# Patient Record
Sex: Male | Born: 1990 | Race: Asian | Hispanic: No | Marital: Married | Smoking: Never smoker
Health system: Southern US, Community
[De-identification: ages and names within clinical notes are randomized; demographics above are authoritative.]

## PROBLEM LIST (undated history)

## (undated) DIAGNOSIS — Z789 Other specified health status: Secondary | ICD-10-CM

## (undated) DIAGNOSIS — C801 Malignant (primary) neoplasm, unspecified: Secondary | ICD-10-CM

## (undated) HISTORY — PX: NO PAST SURGERIES: SHX2092

## (undated) HISTORY — DX: Other specified health status: Z78.9

---

## 2019-06-04 ENCOUNTER — Ambulatory Visit: Payer: Self-pay

## 2019-06-09 ENCOUNTER — Ambulatory Visit: Payer: Self-pay | Attending: Internal Medicine

## 2019-06-09 DIAGNOSIS — Z23 Encounter for immunization: Secondary | ICD-10-CM

## 2019-06-09 NOTE — Progress Notes (Signed)
   Covid-19 Vaccination Clinic  Name:  Yvonne Stueber    MRN: [id number] DOB: 1991-01-24  06/09/2019  Mr. Overacker was observed post Covid-19 immunization for 15 minutes without incident. He was provided with Vaccine Information Sheet and instruction to access the V-Safe system.   Mr. Connley was instructed to call 911 with any severe reactions post vaccine: Marland Kitchen Difficulty breathing  . Swelling of face and throat  . A fast heartbeat  . A bad rash all over body  . Dizziness and weakness   Immunizations Administered    Name Date Dose VIS Date Route   Pfizer COVID-19 Vaccine 06/09/2019  8:23 AM 0.3 mL 02/18/2019 Intramuscular   Manufacturer: [place]   Lot: [id number]   [place]: [id number]

## 2019-07-06 ENCOUNTER — Ambulatory Visit: Payer: Self-pay | Attending: Internal Medicine

## 2019-07-06 DIAGNOSIS — Z23 Encounter for immunization: Secondary | ICD-10-CM

## 2019-07-06 NOTE — Progress Notes (Signed)
   Covid-19 Vaccination Clinic  Name:  Gabriel Wheeler    MRN: [id number] DOB: Jul 20, 1990  07/06/2019  [name] was observed post Covid-19 immunization for 15 minutes without incident. He was provided with Vaccine Information Sheet and instruction to access the V-Safe system.   [name] was instructed to call 911 with any severe reactions post vaccine: Marland Kitchen Difficulty breathing  . Swelling of face and throat  . A fast heartbeat  . A bad rash all over body  . Dizziness and weakness   Immunizations Administered    Name Date Dose VIS Date Route   Pfizer COVID-19 Vaccine 07/06/2019  8:19 AM 0.3 mL 05/04/2018 Intramuscular   Manufacturer: [place]   Lot: [id number]   [place]: [id number]

## 2019-10-05 ENCOUNTER — Ambulatory Visit: Payer: 59 | Attending: Internal Medicine

## 2019-10-05 DIAGNOSIS — Z20822 Contact with and (suspected) exposure to covid-19: Secondary | ICD-10-CM

## 2019-10-06 ENCOUNTER — Ambulatory Visit (INDEPENDENT_AMBULATORY_CARE_PROVIDER_SITE_OTHER): Payer: 59 | Admitting: Sports Medicine

## 2019-10-06 ENCOUNTER — Other Ambulatory Visit: Payer: Self-pay

## 2019-10-06 ENCOUNTER — Encounter: Payer: Self-pay | Admitting: Sports Medicine

## 2019-10-06 DIAGNOSIS — M25561 Pain in right knee: Secondary | ICD-10-CM | POA: Diagnosis not present

## 2019-10-06 LAB — SARS-COV-2, NAA 2 DAY TAT

## 2019-10-06 LAB — NOVEL CORONAVIRUS, NAA: SARS-CoV-2, NAA: NOT DETECTED

## 2019-10-06 NOTE — Assessment & Plan Note (Signed)
This is a pleasant 29 year old male runner, approximately 6 miles per week, he works from home on a computer. 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 Subsequently he did well until recently, had some increasing pain and went to the [facility] medical clinic, x-rays were obtained, he was given some rehab exercises that seem to help, unfortunately continues to have pain, anterior knee, worse with sitting, squatting, going up and down stairs. On exam his ligaments are stable but he continues to have patellofemoral type symptoms, mild patellofemoral compression pain. Poor quadriceps definition. Due to persistent pain, history of hemarthrosis, multiple normal x-rays, and essentially failure of greater than 6 weeks of conservative measures we are going to proceed with an MRI, also given the patellofemoral rehabilitation exercises, return to see me in approximately a month, we will do formal physical therapy and referral for custom molded orthotics if not significantly better.

## 2019-10-06 NOTE — Progress Notes (Signed)
    Procedures performed today:    None.  Independent interpretation of notes and tests performed by another provider:   None.  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 Subsequently he did well until recently, had some increasing pain and went to the [facility] medical clinic, x-rays were obtained, he was given some rehab exercises that seem to help, unfortunately continues to have pain, anterior knee, worse with sitting, squatting, going up and down stairs. On exam his ligaments are stable but he continues to have patellofemoral type symptoms, mild patellofemoral compression pain. Poor quadriceps definition. Due to persistent pain, history of hemarthrosis, multiple normal x-rays, and essentially failure of greater than 6 weeks of conservative measures we are going to proceed with an MRI, also given the patellofemoral rehabilitation exercises, return to see me in approximately a month, we will do formal physical therapy and referral for custom molded orthotics if not significantly better.    ___________________________________________ Gwen Her. Dianah Field, M.D., ABFM., CAQSM. Primary Care and [place] Instructor of [place] of [facility]

## 2019-10-09 ENCOUNTER — Ambulatory Visit (INDEPENDENT_AMBULATORY_CARE_PROVIDER_SITE_OTHER): Payer: 59

## 2019-10-09 ENCOUNTER — Other Ambulatory Visit: Payer: Self-pay

## 2019-10-09 DIAGNOSIS — M25561 Pain in right knee: Secondary | ICD-10-CM

## 2019-10-09 IMAGING — MR MR KNEE*R* W/O CM
7 series · 40 of 40 positions shown · non-contrast
Comparison: None.

CLINICAL DATA: Chronic anterior knee pain following original injury
playing basketball 10 years ago. Worsening pain over the last 2
years. No previous relevant surgery.

EXAM:
MRI OF THE RIGHT KNEE WITHOUT CONTRAST
TECHNIQUE: Multiplanar, multisequence MR imaging of the knee was performed. No
intravenous contrast was administered.

[Series 6: T2 fat-sat · axial · 4.0mm · 0.53mm/px · z∈[-76,+94]mm · 9 of 35 slices shown (1 of 3)]
[im 1/35]
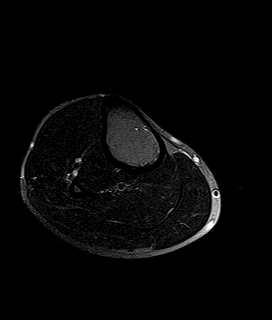
[im 5/35]
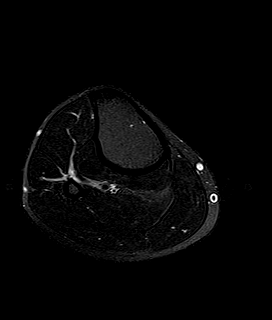
[im 9/35]
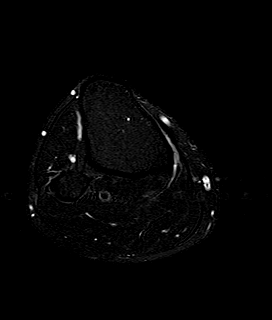
[im 13/35]
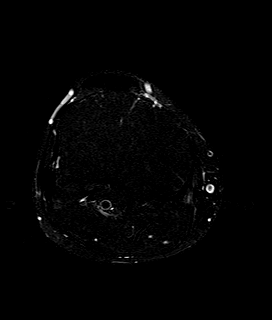
[im 18/35]
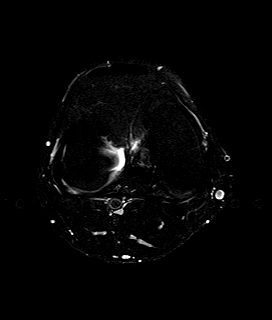
[im 22/35]
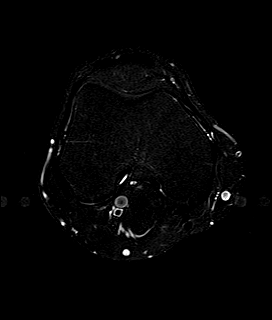
[im 26/35]
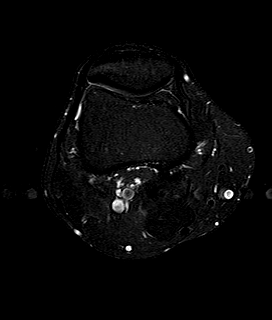
[im 30/35]
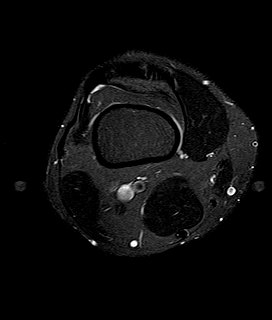
[im 35/35]
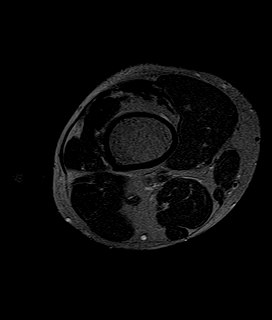

[Series 8: T1 · coronal · 4.0mm · 0.62mm/px · 5 of 25 slices shown]
[im 1/25]
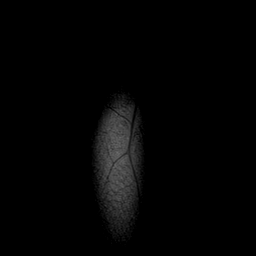
[im 7/25]
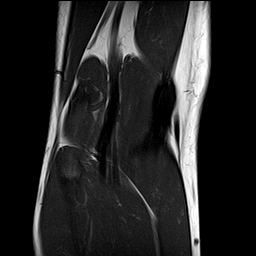
[im 13/25]
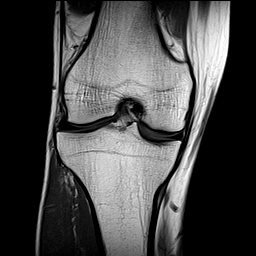
[im 19/25]
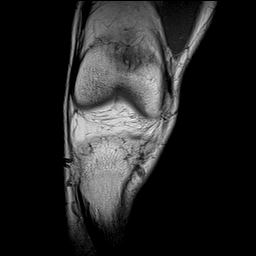
[im 25/25]
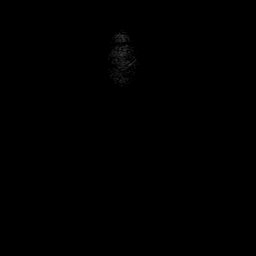

[Series 9: T2 fat-sat · coronal · 4.0mm · 0.62mm/px · 5 of 25 slices shown (2 of 3)]
[im 1/25]
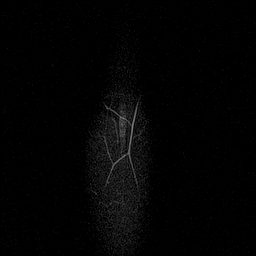
[im 7/25]
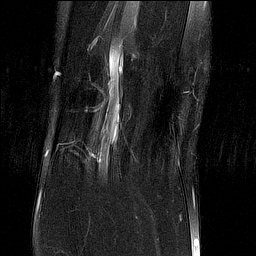
[im 13/25]
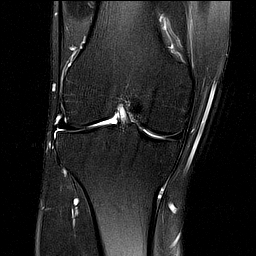
[im 19/25]
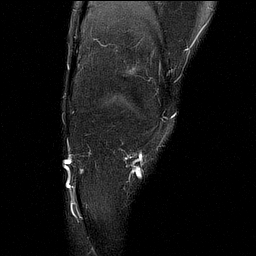
[im 25/25]
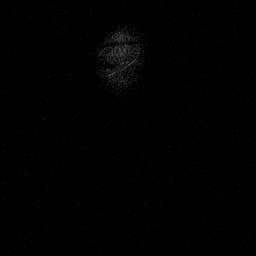

[Series 10: PD fat-sat · coronal · 4.0mm · 0.62mm/px · 5 of 25 slices shown (1 of 3)]
[im 1/25]
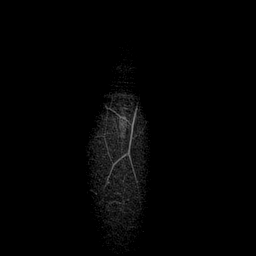
[im 7/25]
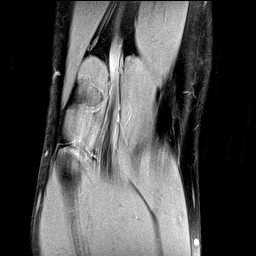
[im 13/25]
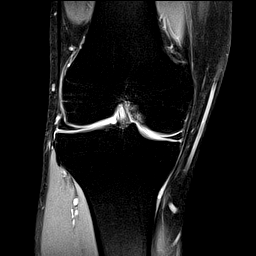
[im 19/25]
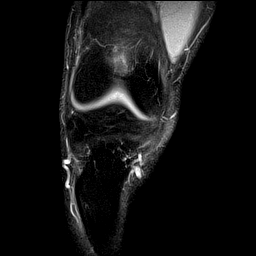
[im 25/25]
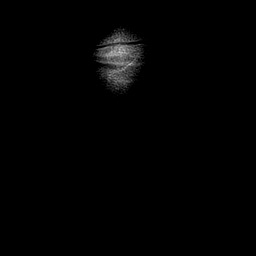

[Series 11: PD fat-sat · sagittal · 3.0mm · 0.62mm/px · 6 of 28 slices shown (2 of 3)]
[im 1/28]
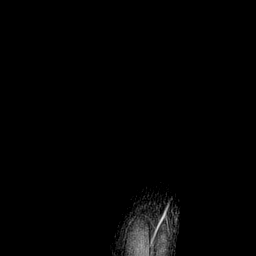
[im 6/28]
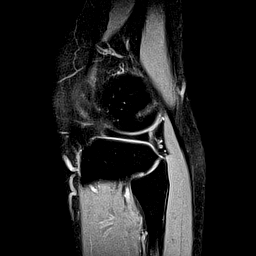
[im 11/28]
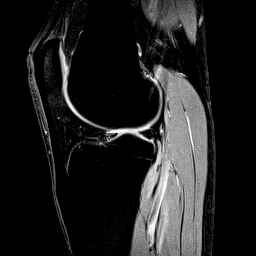
[im 17/28]
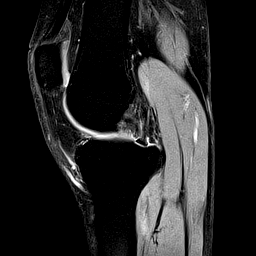
[im 22/28]
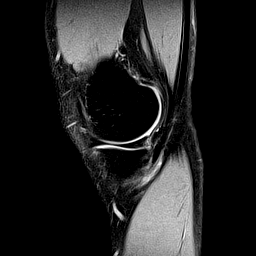
[im 28/28]
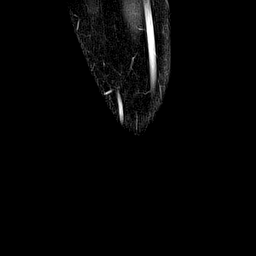

[Series 12: T2 fat-sat · sagittal · 3.0mm · 0.62mm/px · 6 of 28 slices shown (3 of 3)]
[im 1/28]
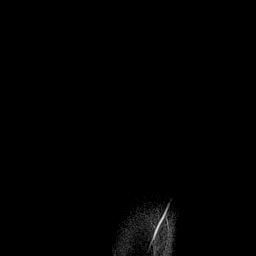
[im 6/28]
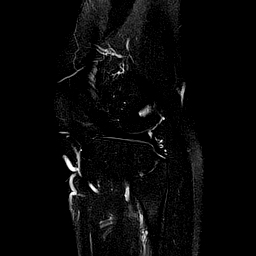
[im 11/28]
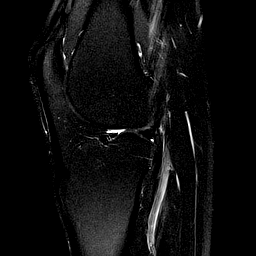
[im 17/28]
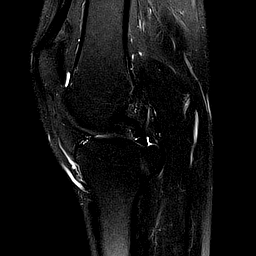
[im 22/28]
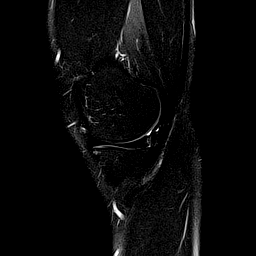
[im 28/28]
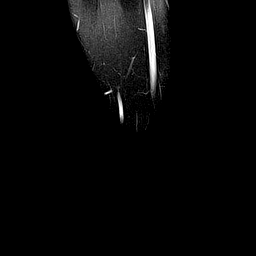

[Series 13: PD fat-sat · oblique · 2.0mm · 0.62mm/px · 4 of 19 slices shown (3 of 3)]
[im 1/19]
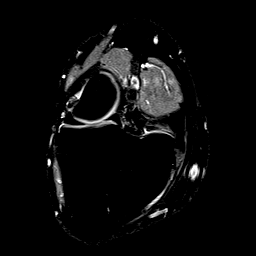
[im 7/19]
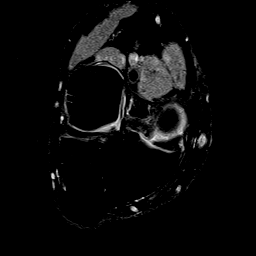
[im 13/19]
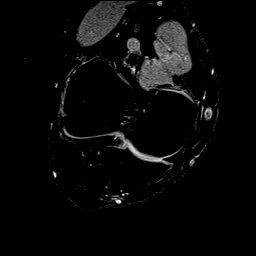
[im 19/19]
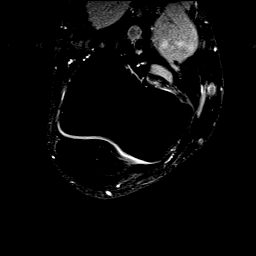

[40 of 40 positions shown; findings below may reference images not displayed]

FINDINGS: MENISCI

Medial meniscus:  Intact with normal morphology.

Lateral meniscus:  Discoid configuration without evidence of tear.

LIGAMENTS

Cruciates: Intact. The anterior cruciate ligament is relatively
diminutive with mild mucoid degeneration, although there are intact
fibers maintaining a normal orientation. The PCL is also relatively
diminutive, although intact.

Collaterals:  Intact.

CARTILAGE

Patellofemoral:  Preserved.

Medial:  Preserved.

Lateral:  Preserved.

MISCELLANEOUS

Joint:  No significant joint effusion.

Popliteal Fossa:  Unremarkable. No significant Baker's cyst.

Extensor Mechanism:  Intact.

Bones:  No acute or significant extra-articular osseous findings.

Other: No other significant periarticular soft tissue findings.
IMPRESSION: 1. No acute findings or evidence of internal derangement. The
menisci, cruciate and collateral ligaments are intact.
2. Discoid lateral meniscus without evidence of tear.
3. Mild mucoid degeneration of the anterior cruciate ligament.

## 2019-10-10 ENCOUNTER — Encounter (INDEPENDENT_AMBULATORY_CARE_PROVIDER_SITE_OTHER): Payer: 59

## 2019-10-10 DIAGNOSIS — M25561 Pain in right knee: Secondary | ICD-10-CM | POA: Diagnosis not present

## 2019-11-03 ENCOUNTER — Ambulatory Visit: Payer: 59 | Admitting: Sports Medicine

## 2020-01-21 ENCOUNTER — Other Ambulatory Visit: Payer: Self-pay

## 2020-01-21 ENCOUNTER — Ambulatory Visit: Payer: 59 | Attending: Internal Medicine

## 2020-01-21 DIAGNOSIS — Z23 Encounter for immunization: Secondary | ICD-10-CM

## 2020-01-21 NOTE — Progress Notes (Signed)
   Covid-19 Vaccination Clinic  Name:  Gabriel Wheeler    MRN: [id number] DOB: 30-Apr-1990  01/21/2020  Mr. Deines was observed post Covid-19 immunization for 15 minutes without incident. He was provided with Vaccine Information Sheet and instruction to access the V-Safe system.   Mr. Boivin was instructed to call 911 with any severe reactions post vaccine: Marland Kitchen Difficulty breathing  . Swelling of face and throat  . A fast heartbeat  . A bad rash all over body  . Dizziness and weakness   Immunizations Administered    Name Date Dose VIS Date Route   Pfizer COVID-19 Vaccine 01/21/2020  1:56 PM 0.3 mL 12/28/2019 Intramuscular   Manufacturer: [place]   Lot: [id number]   [place]: [id number]

## 2020-11-16 ENCOUNTER — Ambulatory Visit (HOSPITAL_COMMUNITY): Admission: EM | Admit: 2020-11-16 | Discharge: 2020-11-16 | Discharge status: Against Medical Advice | Payer: 59

## 2020-11-16 ENCOUNTER — Ambulatory Visit (HOSPITAL_BASED_OUTPATIENT_CLINIC_OR_DEPARTMENT_OTHER)
Admission: RE | Admit: 2020-11-16 | Discharge: 2020-11-16 | Disposition: A | Discharge status: Home / Self Care | Payer: 59 | Source: Ambulatory Visit | Attending: Internal Medicine | Admitting: Internal Medicine

## 2020-11-16 ENCOUNTER — Other Ambulatory Visit (HOSPITAL_COMMUNITY): Payer: Self-pay | Admitting: Internal Medicine

## 2020-11-16 ENCOUNTER — Other Ambulatory Visit: Payer: Self-pay | Admitting: Internal Medicine

## 2020-11-16 ENCOUNTER — Other Ambulatory Visit: Payer: Self-pay

## 2020-11-16 ENCOUNTER — Other Ambulatory Visit (HOSPITAL_BASED_OUTPATIENT_CLINIC_OR_DEPARTMENT_OTHER): Payer: Self-pay | Admitting: Internal Medicine

## 2020-11-16 DIAGNOSIS — R310 Gross hematuria: Secondary | ICD-10-CM | POA: Diagnosis present

## 2020-11-16 IMAGING — US US RENAL
1 series · 14 of 25 positions shown · non-contrast
Comparison: None.

CLINICAL DATA: Right-sided back pain lymphoma

EXAM:
RENAL / URINARY TRACT ULTRASOUND COMPLETE

[Series 1: us renal · 80 acquisitions, 14 frames shown]
[im 1/80]
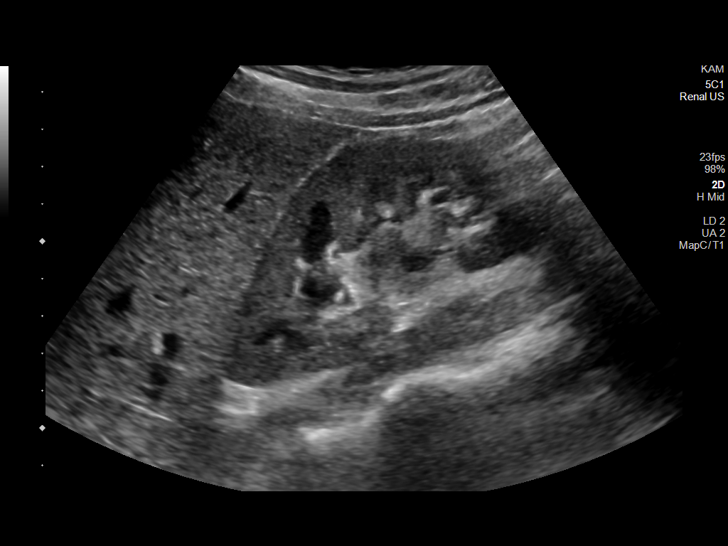
[im 7/80]
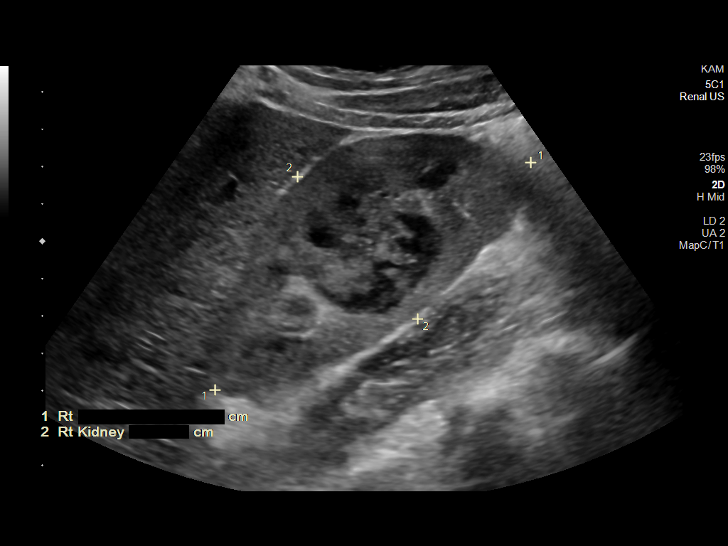
[im 14/80]
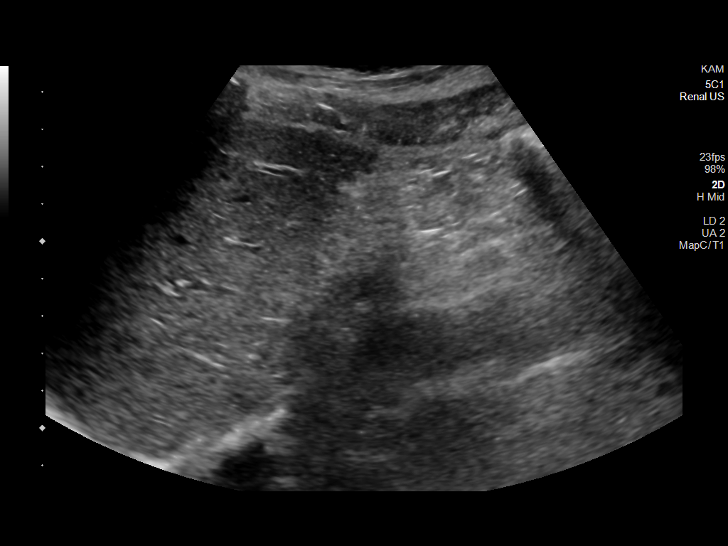
[im 20/80]
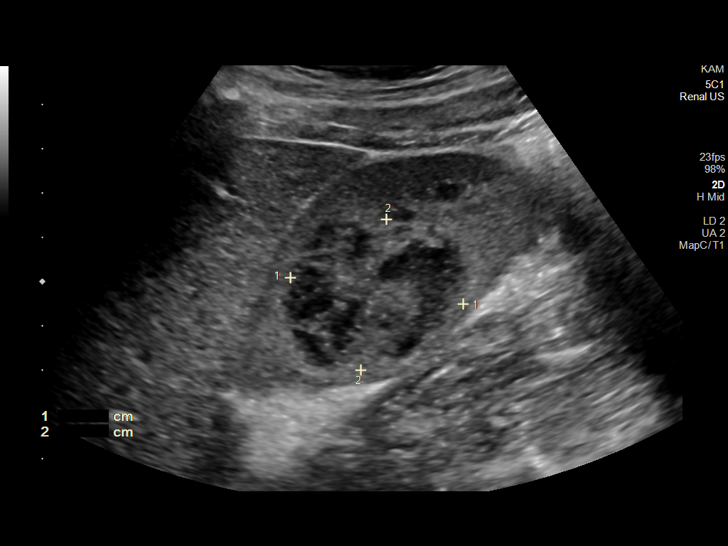
[im 27/80]
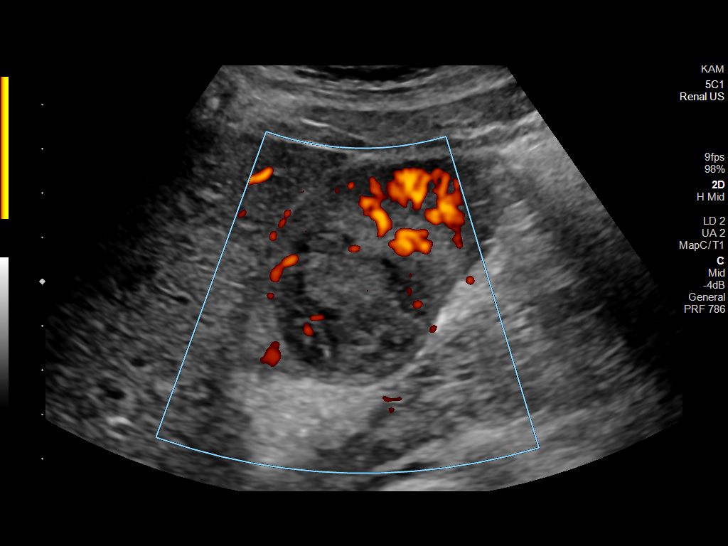
[im 30/80]
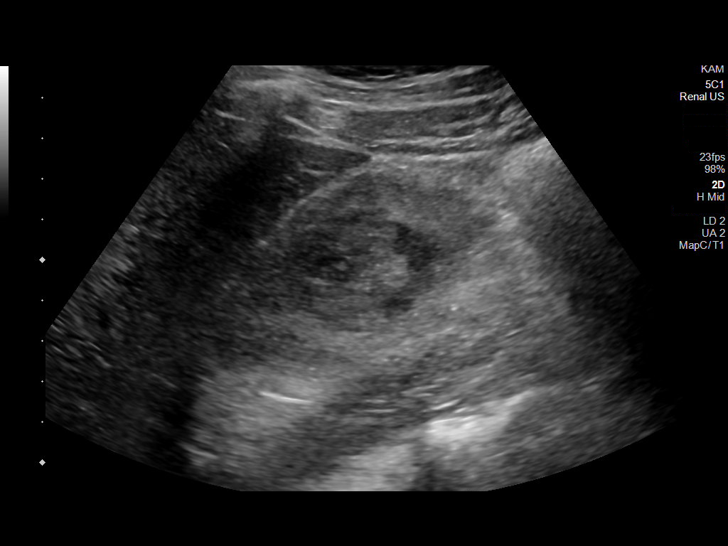
[im 37/80]
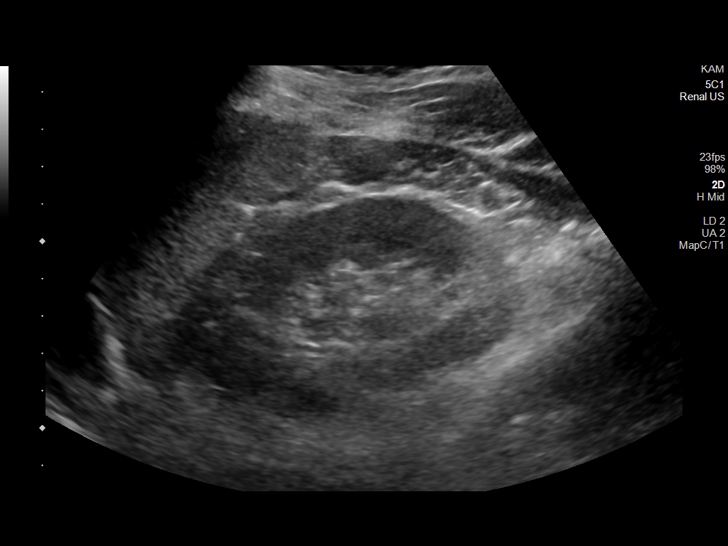
[im 43/80]
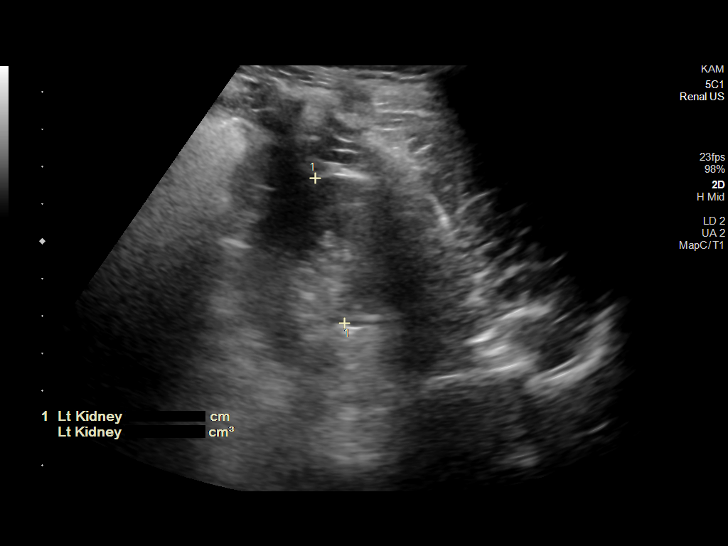
[im 50/80]
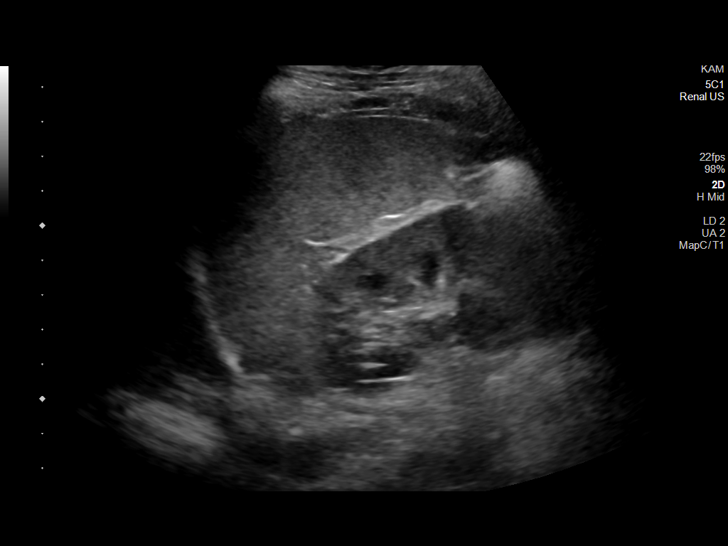
[im 53/80]
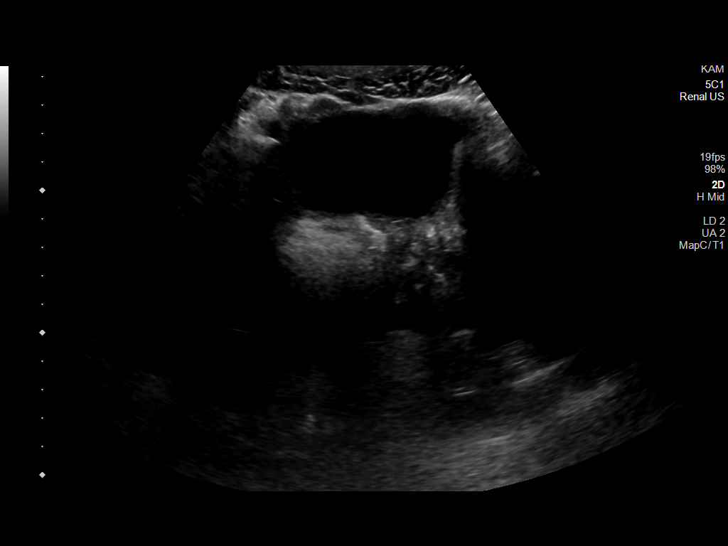
[im 60/80]
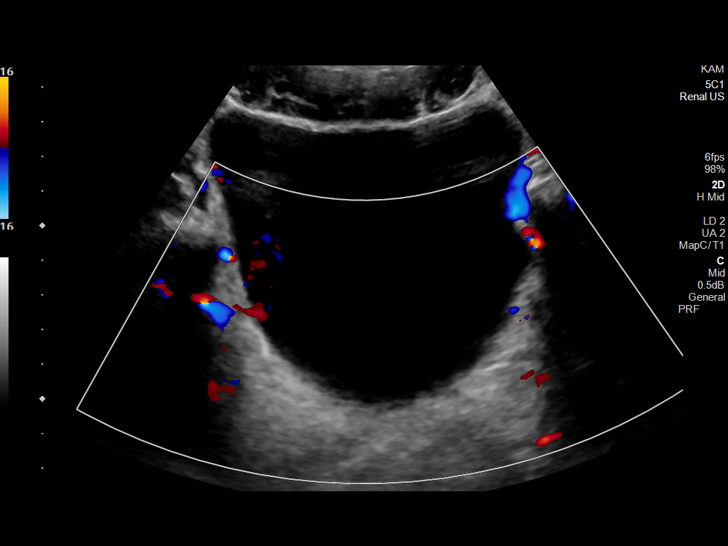
[im 66/80]
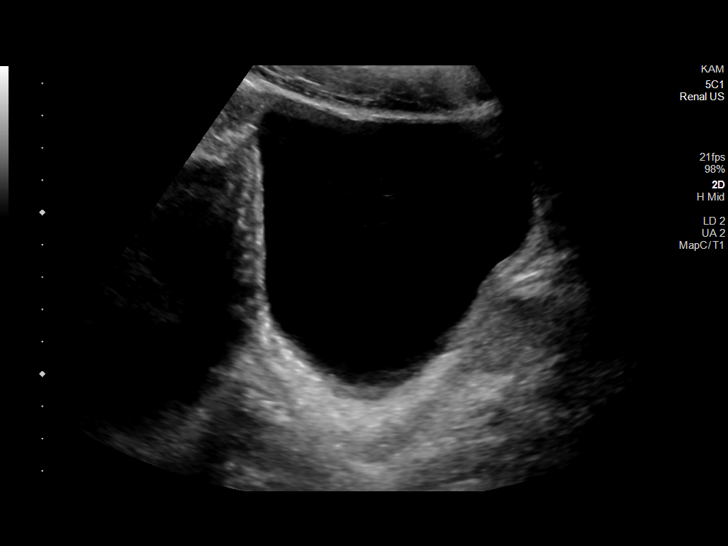
[im 73/80]
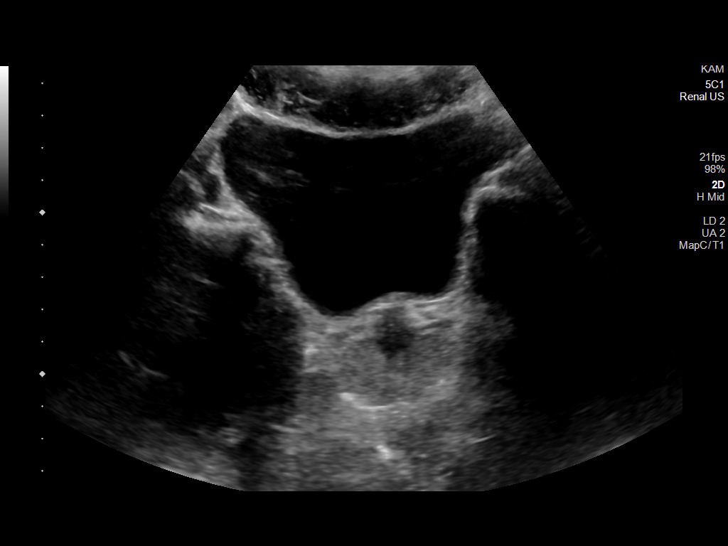
[im 80/80]
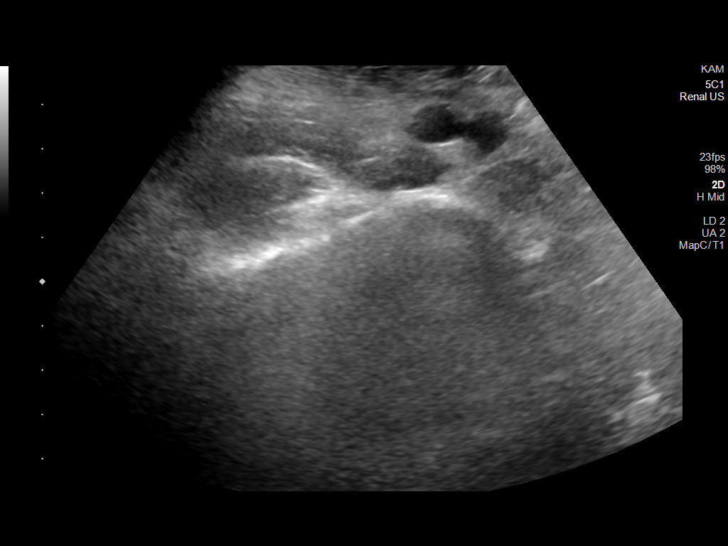

[14 of 25 positions shown; findings below may reference images not displayed]

FINDINGS: Right Kidney:

Renal measurements: 10.4 x 5 x 5.3 cm = volume: 145 mL. No
hydronephrosis. Heterogenous cystic and solid mass within the mid
right kidney measuring 4 x 3.5 x 3.1 cm.

Left Kidney:

Renal measurements: 9.8 x 5.4 x 4 cm = volume: 110 mL. Echogenicity
within normal limits. No mass or hydronephrosis visualized.

Bladder:

Appears normal for degree of bladder distention.

Other:

None.
IMPRESSION: 1. Heterogenous 4 cm cystic and solid mass within the right kidney.
Differential considerations could include renal abscess if symptoms
are suggestive of infection; renal neoplasm is also a primary
concern. Further evaluation with CT or MRI should be considered.
2. Normal ultrasound appearance of left kidney

These results will be called to the ordering clinician or
representative by the Radiologist Assistant, and communication
documented in the PACS or [REDACTED].

## 2020-11-16 NOTE — ED Notes (Signed)
No answer, voice message left. No answer in waiting area.

## 2020-11-19 ENCOUNTER — Other Ambulatory Visit (HOSPITAL_COMMUNITY): Payer: Self-pay | Admitting: Internal Medicine

## 2020-11-19 ENCOUNTER — Other Ambulatory Visit: Payer: Self-pay | Admitting: Internal Medicine

## 2020-11-19 DIAGNOSIS — N2889 Other specified disorders of kidney and ureter: Secondary | ICD-10-CM

## 2020-11-21 ENCOUNTER — Other Ambulatory Visit: Payer: Self-pay | Admitting: Internal Medicine

## 2020-11-21 DIAGNOSIS — N2889 Other specified disorders of kidney and ureter: Secondary | ICD-10-CM

## 2020-11-22 ENCOUNTER — Other Ambulatory Visit: Payer: Self-pay

## 2020-11-22 ENCOUNTER — Ambulatory Visit
Admission: RE | Admit: 2020-11-22 | Discharge: 2020-11-22 | Disposition: A | Discharge status: Home / Self Care | Payer: 59 | Source: Ambulatory Visit | Attending: Internal Medicine | Admitting: Internal Medicine

## 2020-11-22 DIAGNOSIS — N2889 Other specified disorders of kidney and ureter: Secondary | ICD-10-CM | POA: Diagnosis not present

## 2020-11-22 IMAGING — CT CT ABD-PEL WO/W CM
2 of 13 series · 11 of 46 positions shown, 17 images · IV contrast (omnipaque)
Comparison: Reports from prior PET-CT [DATE] and prior MRI
dated [DATE].

CLINICAL DATA: Follicular lymphoma. Right-sided back pain recently,
right-sided cystic and solid mass in the kidney on ultrasound.

EXAM:
CT ABDOMEN AND PELVIS WITHOUT AND WITH CONTRAST
TECHNIQUE: Multidetector CT imaging of the abdomen and pelvis was performed
following the standard protocol before and following the bolus
administration of intravenous contrast.
CONTRAST:  80mL OMNIPAQUE IOHEXOL 350 MG/ML SOLN

[Series 11: nephrographic · axial · 0.80mm/px · z∈[-986,-632]mm · 9 of 146 slices shown, 15 images]
[im 14/146  soft-tissue]
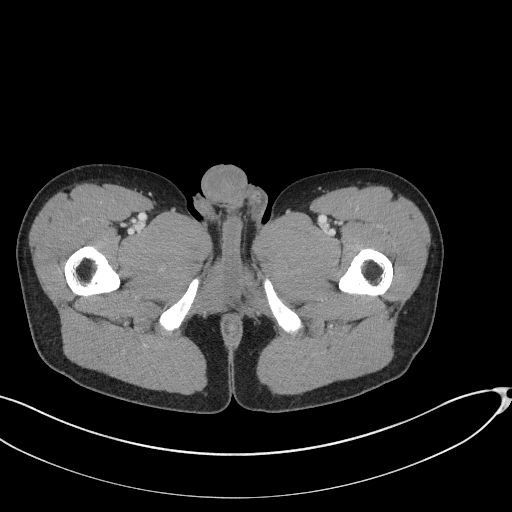
[im 14/146  bone]
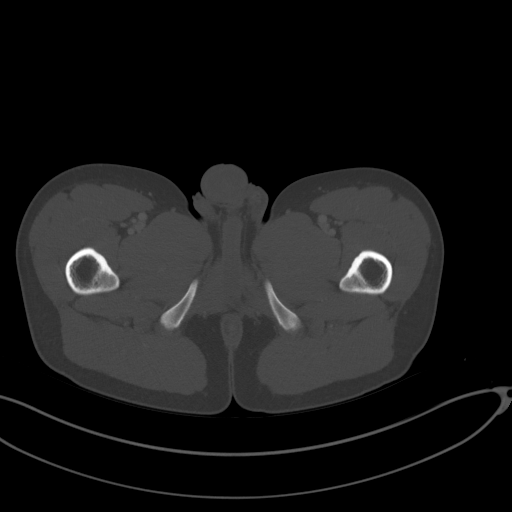
[im 27/146  soft-tissue]
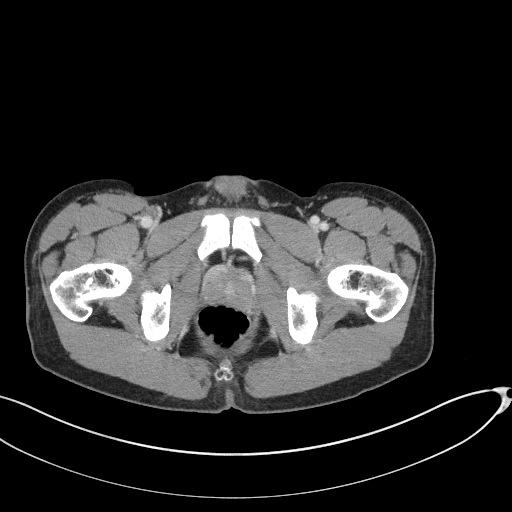
[im 40/146  soft-tissue]
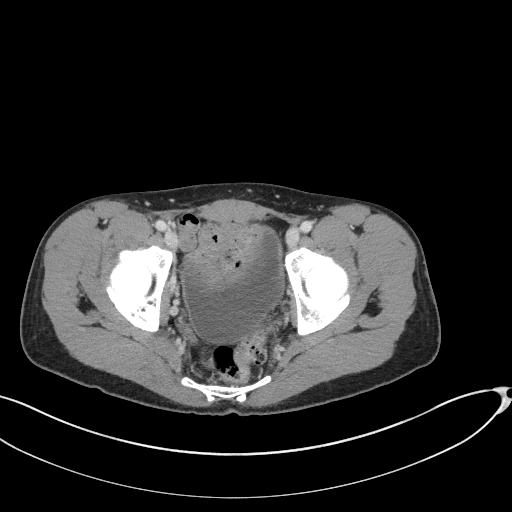
[im 53/146  soft-tissue]
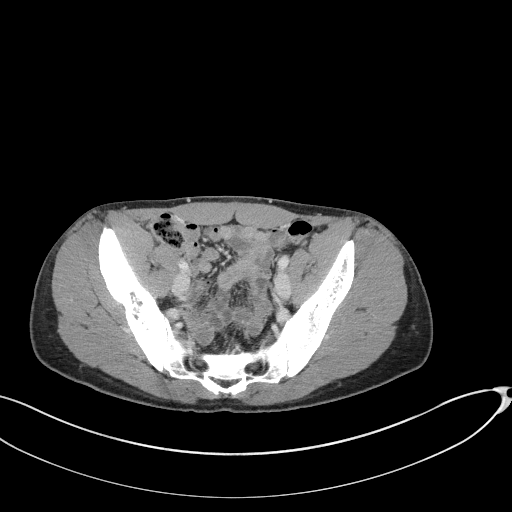
[im 80/146  soft-tissue]
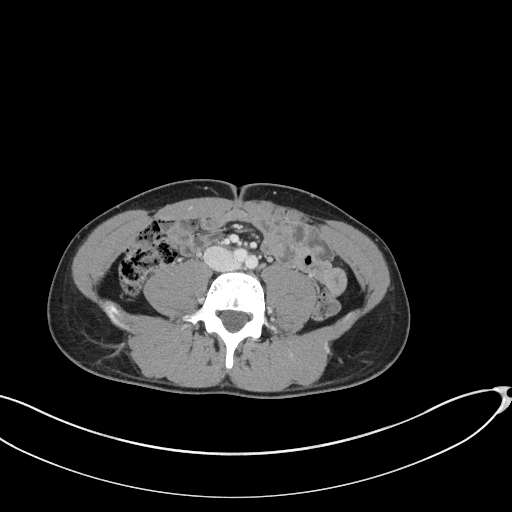
[im 93/146  soft-tissue]
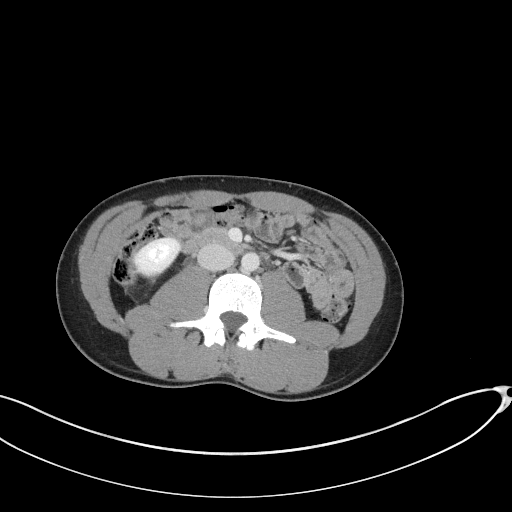
[im 93/146  lung]
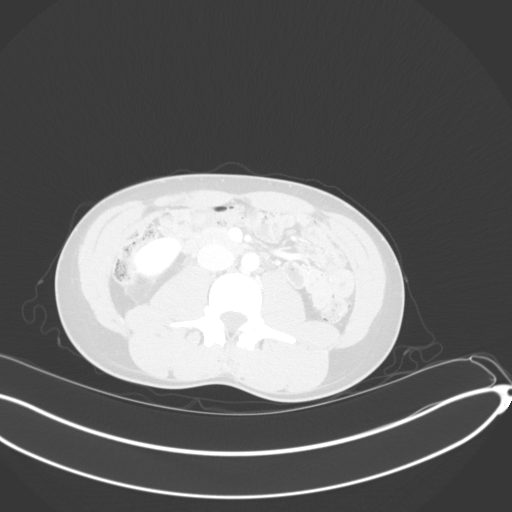
[im 106/146  soft-tissue]
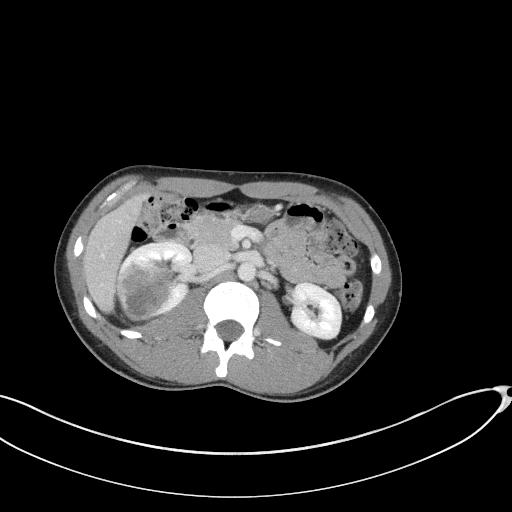
[im 106/146  lung]
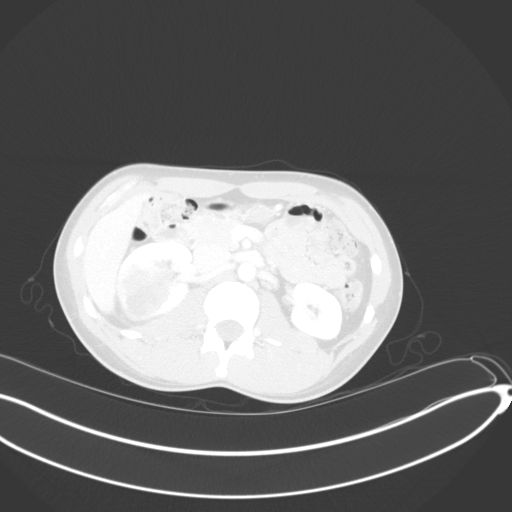
[im 119/146  soft-tissue]
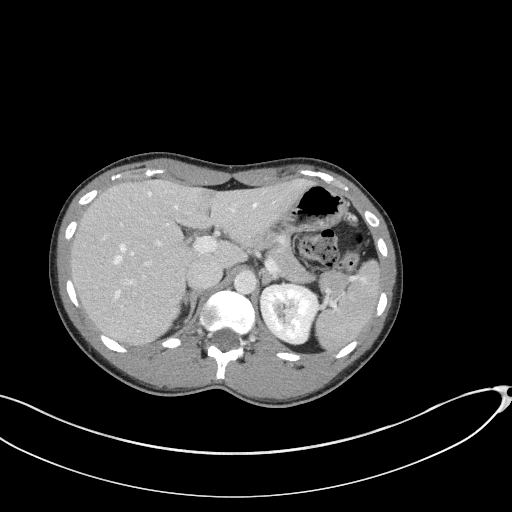
[im 119/146  lung]
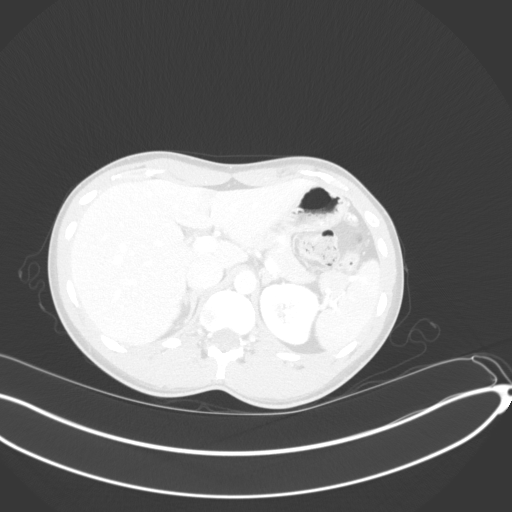
[im 132/146  soft-tissue]
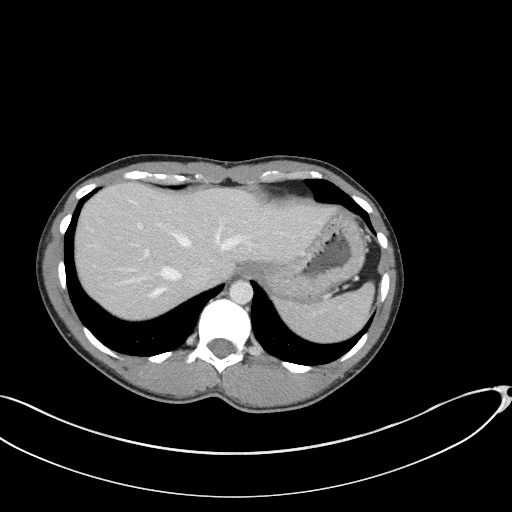
[im 132/146  lung]
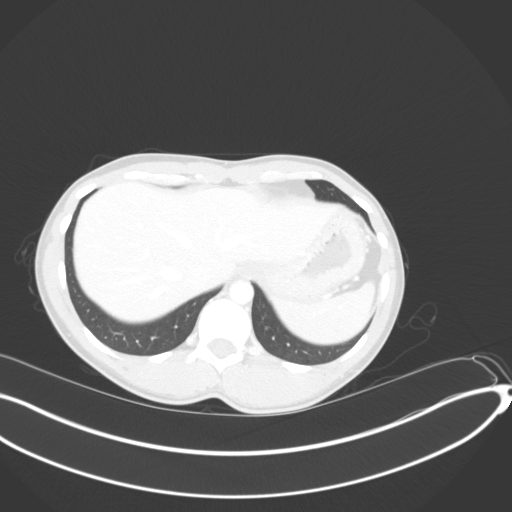
[im 132/146  bone]
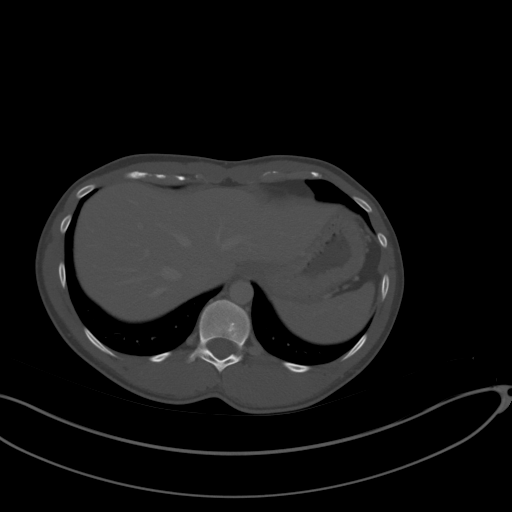

[Series 12: coronal nephro · coronal · 0.88mm/px · 2 of 106 slices shown]
[im 36/106  soft-tissue]
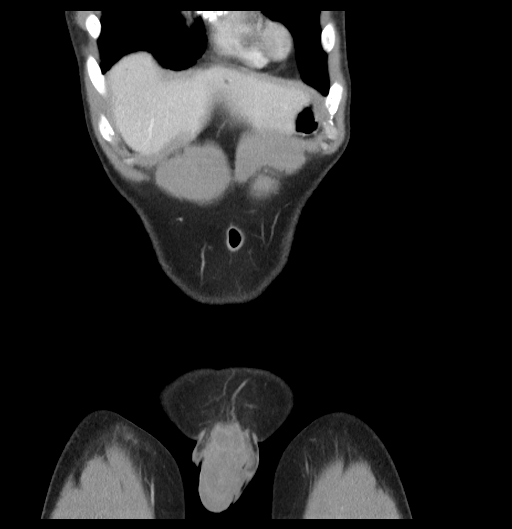
[im 71/106  soft-tissue]
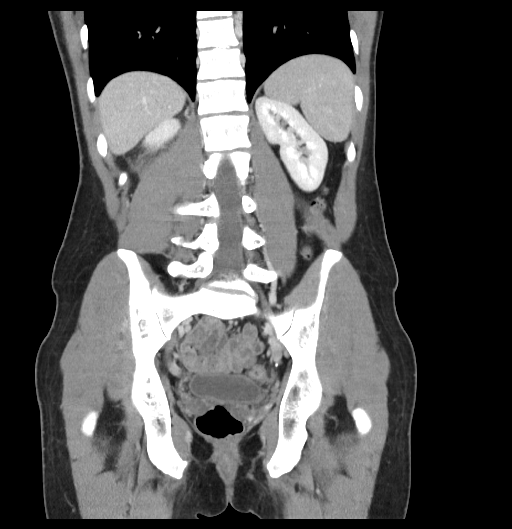

[11 of 46 positions shown; findings below may reference images not displayed]

FINDINGS: Lower chest: Unremarkable

Hepatobiliary: Punctate calcification anteriorly in the liver on
image 11 series 11 likely from remote granulomatous disease. 3 mm
hypodense lesion anteriorly in the left hepatic lobe on image 18
series 11, likely a tiny cyst although technically too small to
characterize. Contracted gallbladder. Common bile duct 5 mm in
diameter, upper normal size.

Pancreas: Unremarkable

Spleen: Unremarkable

Adrenals/Urinary Tract: Both adrenal glands appear normal.

Corresponding to the sonographic abnormality we demonstrate a
heterogeneously enhancing right renal mass measuring 5.1 by 4.7 by
4.4 cm, partially extending into the right renal hilum where there
is effacement of the right posterior mid kidney collecting system in
the region of the mass. There is some adjacent hypoenhancement of
the renal cortex and the lesion extends beyond the expected confines
of the normal renal parenchyma into the perirenal space with
stranding in the perirenal space. No other renal lesions are
identified. Urinary bladder unremarkable.

Stomach/Bowel: Unremarkable

Vascular/Lymphatic: Clustered small retroperitoneal lymph nodes are
present in the left periaortic region, individually measuring up to
about 0.7 cm in diameter on image 56 series 10. There is evidence of
prior right inguinal lymph node dissection, no current pathologic
adenopathy is observed.

Reproductive: Unremarkable

Other: About 4.2 cm below the inferior border of the spleen, a
by 0.6 by 0.8 cm retroperitoneal nodule on image 50 of series 11 is
present. This could well simply represent accessory spleen although
is somewhat unusually located and could represent a separate
neoplastic retroperitoneal nodule or abnormal lymph node.

Musculoskeletal: Substantial sclerosis and trabecular coarsening in
the right iliac bone, on prior MRI examinations this is been
attributed to lymphoma which is most likely, although active Paget's
disease could have a similar CT appearance and correlation with
prior biopsy results is suggested.

There are chronic bilateral pars interarticularis defects at L5 with
5 mm of associated anterolisthesis of L5 on S1. There is also spina
bifida occulta at the L5 level and also at S1. Mild levoconvex
lumbar scoliosis.
IMPRESSION: 1. A 5.1 cm heterogeneously enhancing lesion of the right mid kidney
extending into the right renal pelvis and effacing part of the right
renal collecting system is present. Given the hypoenhancement of the
adjacent renal cortex and the fact that this lesion was not
described on some fairly recent examinations including PET-CT from
[DATE], I tend to favor renal abscess over renal lymphoma,
although both are differential diagnostic considerations. Correlate
with typical differentiating features such as fever, leukocytosis,
and urine analysis results.
2. Substantial sclerosis and trabecular coarsening in the right
iliac bone, compatible with previous reported lymphomatous
involvement (although Paget's disease can sometimes have a similar
CT appearance).
3. Small retroperitoneal lymph nodes are not pathologically enlarged
and may be reactive.
4. A 0.8 cm retroperitoneal nodule on the left may well represent
accessory spleen but is somewhat low in position for an accessory
spleen. If this lesion was present on the prior PET-CT of [DATE]
and appeared similar to spleen activity, that would further
reinforce that this is likely simply accessory splenic tissue rather
than a neoplastic nodule or lymph node; I am happy to review this
myself and issue an addendum if the outside PET-CT from [facility]
[REDACTED] can be provided (I currently have access to
the report but not to the images).
5. Chronic bilateral pars defects at L5 along with spina bifida
occulta.

## 2020-11-22 MED ORDER — IOHEXOL 350 MG/ML SOLN
80.0000 mL | Freq: Once | INTRAVENOUS | Status: AC | PRN
  Administered 2020-11-22: 80 mL via INTRAVENOUS

## 2021-01-19 ENCOUNTER — Other Ambulatory Visit: Payer: Self-pay

## 2021-01-19 ENCOUNTER — Emergency Department (HOSPITAL_COMMUNITY): Payer: 59

## 2021-01-19 ENCOUNTER — Emergency Department (HOSPITAL_COMMUNITY)
Admission: EM | Admit: 2021-01-19 | Discharge: 2021-01-20 | Disposition: A | Discharge status: Home / Self Care | Payer: 59 | Attending: Emergency Medicine | Admitting: Emergency Medicine

## 2021-01-19 ENCOUNTER — Encounter (HOSPITAL_COMMUNITY): Payer: Self-pay

## 2021-01-19 DIAGNOSIS — M62830 Muscle spasm of back: Secondary | ICD-10-CM | POA: Insufficient documentation

## 2021-01-19 DIAGNOSIS — D701 Agranulocytosis secondary to cancer chemotherapy: Secondary | ICD-10-CM

## 2021-01-19 DIAGNOSIS — T451X5A Adverse effect of antineoplastic and immunosuppressive drugs, initial encounter: Secondary | ICD-10-CM

## 2021-01-19 DIAGNOSIS — M546 Pain in thoracic spine: Secondary | ICD-10-CM | POA: Diagnosis present

## 2021-01-19 DIAGNOSIS — C761 Malignant neoplasm of thorax: Secondary | ICD-10-CM | POA: Insufficient documentation

## 2021-01-19 DIAGNOSIS — D696 Thrombocytopenia, unspecified: Secondary | ICD-10-CM

## 2021-01-19 DIAGNOSIS — M62838 Other muscle spasm: Secondary | ICD-10-CM

## 2021-01-19 HISTORY — DX: Malignant (primary) neoplasm, unspecified: C80.1

## 2021-01-19 LAB — CBC WITH DIFFERENTIAL/PLATELET
Abs Immature Granulocytes: 0.08 10*3/uL — ABNORMAL HIGH (ref 0.00–0.07)
Basophils Absolute: 0 10*3/uL (ref 0.0–0.1)
Basophils Relative: 1 %
Eosinophils Absolute: 0.1 10*3/uL (ref 0.0–0.5)
Eosinophils Relative: 4 %
HCT: 38.8 % — ABNORMAL LOW (ref 39.0–52.0)
Hemoglobin: 14.4 g/dL (ref 13.0–17.0)
Immature Granulocytes: 6 %
Lymphocytes Relative: 39 %
Lymphs Abs: 0.6 10*3/uL — ABNORMAL LOW (ref 0.7–4.0)
MCH: 29.9 pg (ref 26.0–34.0)
MCHC: 37.1 g/dL — ABNORMAL HIGH (ref 30.0–36.0)
MCV: 80.7 fL (ref 80.0–100.0)
Monocytes Absolute: 0.5 10*3/uL (ref 0.1–1.0)
Monocytes Relative: 37 %
Neutro Abs: 0.2 10*3/uL — CL (ref 1.7–7.7)
Neutrophils Relative %: 13 %
Platelets: 67 10*3/uL — ABNORMAL LOW (ref 150–400)
RBC: 4.81 MIL/uL (ref 4.22–5.81)
RDW: 11.8 % (ref 11.5–15.5)
WBC: 1.4 10*3/uL — CL (ref 4.0–10.5)
nRBC: 1.4 % — ABNORMAL HIGH (ref 0.0–0.2)

## 2021-01-19 LAB — COMPREHENSIVE METABOLIC PANEL
ALT: 26 U/L (ref 0–44)
AST: 25 U/L (ref 15–41)
Albumin: 4.9 g/dL (ref 3.5–5.0)
Alkaline Phosphatase: 96 U/L (ref 38–126)
Anion gap: 10 (ref 5–15)
BUN: 17 mg/dL (ref 6–20)
CO2: 29 mmol/L (ref 22–32)
Calcium: 10.1 mg/dL (ref 8.9–10.3)
Chloride: 96 mmol/L — ABNORMAL LOW (ref 98–111)
Creatinine, Ser: 0.87 mg/dL (ref 0.61–1.24)
GFR, Estimated: >60 mL/min (ref >60)
Glucose, Bld: 106 mg/dL — ABNORMAL HIGH (ref 70–99)
Potassium: 4.8 mmol/L (ref 3.5–5.1)
Sodium: 135 mmol/L (ref 135–145)
Total Bilirubin: 1 mg/dL (ref 0.3–1.2)
Total Protein: 7.8 g/dL (ref 6.5–8.1)

## 2021-01-19 LAB — URINALYSIS, ROUTINE W REFLEX MICROSCOPIC
Bilirubin Urine: NEGATIVE
Glucose, UA: 150 mg/dL — AB
Hgb urine dipstick: NEGATIVE
Ketones, ur: NEGATIVE mg/dL
Leukocytes,Ua: NEGATIVE
Nitrite: NEGATIVE
Protein, ur: NEGATIVE mg/dL
Specific Gravity, Urine: 1.018 (ref 1.005–1.030)
pH: 7 (ref 5.0–8.0)

## 2021-01-19 LAB — MAGNESIUM: Magnesium: 2.1 mg/dL (ref 1.7–2.4)

## 2021-01-19 IMAGING — MR MR THORACIC SPINE WO/W CM
9 of 10 series · 35 of 48 positions shown · IV contrast (gadavist)
Comparison: None available.

CLINICAL DATA: Initial evaluation for acute back pain, history of
lymphoma, recent insert thecal chemotherapy administration. Evaluate
for hematoma, neoplasm, infection.

EXAM:
MRI THORACIC WITHOUT AND WITH CONTRAST
TECHNIQUE: Multiplanar and multiecho pulse sequences of the thoracic spine were
obtained without and with intravenous contrast.
CONTRAST:  6mL GADAVIST GADOBUTROL 1 MMOL/ML IV SOLN

[Series 16: T1 · sagittal · 4.0mm · 1.72mm/px · 3 of 10 slices shown (1 of 3)]
[im 1/10]
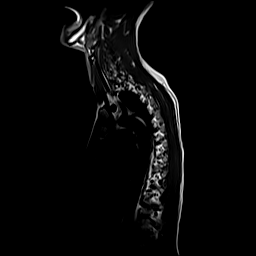
[im 5/10]
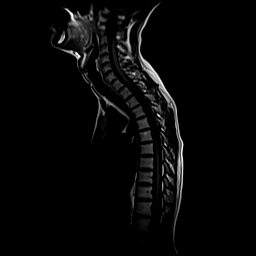
[im 10/10]
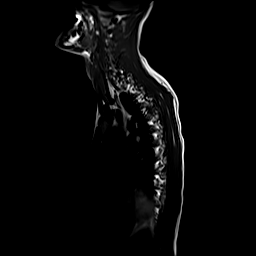

[Series 17: STIR · sagittal · 3.0mm · 1.00mm/px · 5 of 18 slices shown]
[im 1/18]
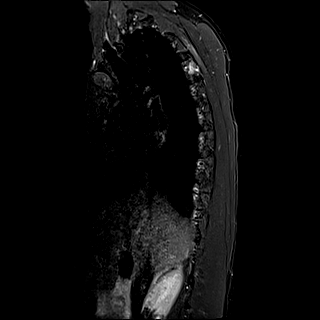
[im 5/18]
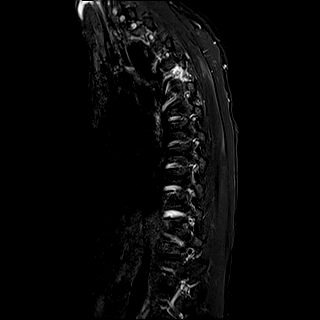
[im 9/18]
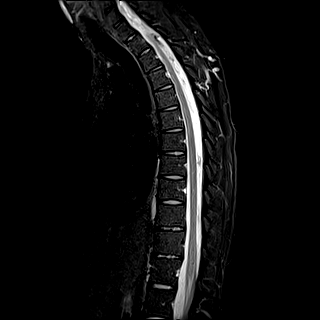
[im 13/18]
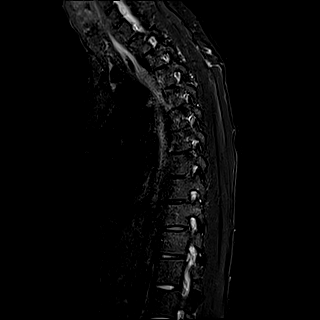
[im 18/18]
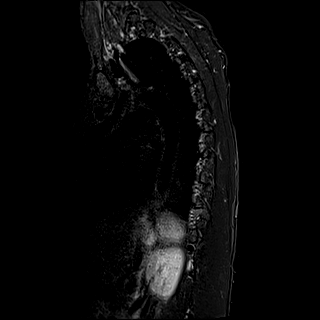

[Series 18: T1 · sagittal · 3.0mm · 1.00mm/px · 4 of 15 slices shown (2 of 3)]
[im 1/15]
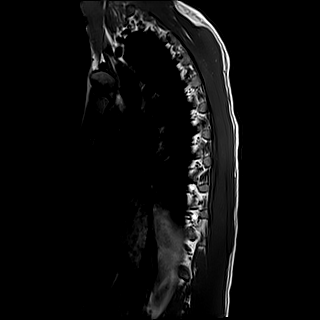
[im 5/15]
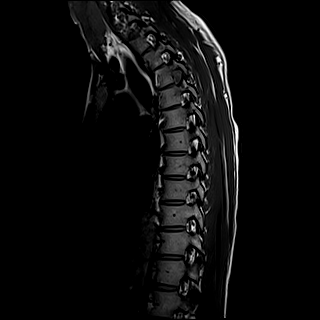
[im 10/15]
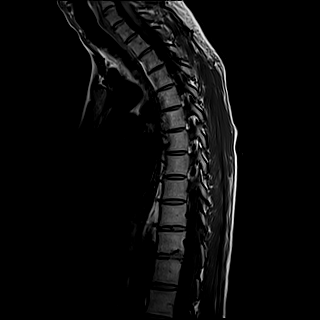
[im 15/15]
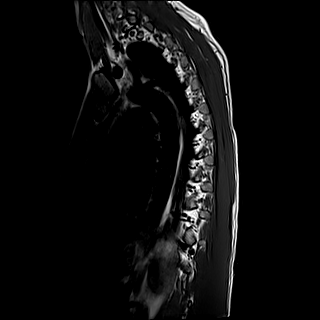

[Series 19: T2 · sagittal · 3.0mm · 0.83mm/px · 5 of 18 slices shown (1 of 2)]
[im 1/18]
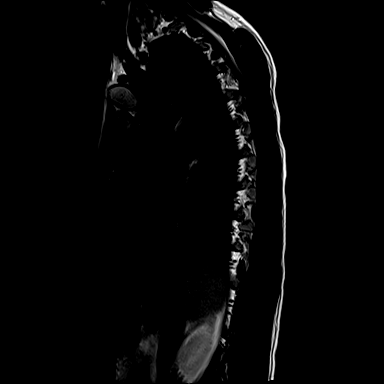
[im 5/18]
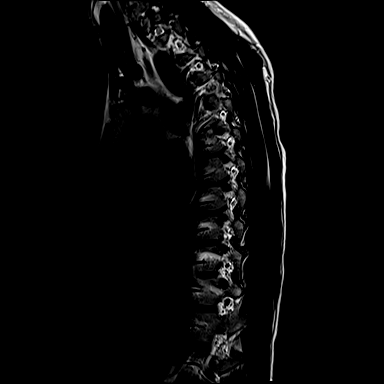
[im 9/18]
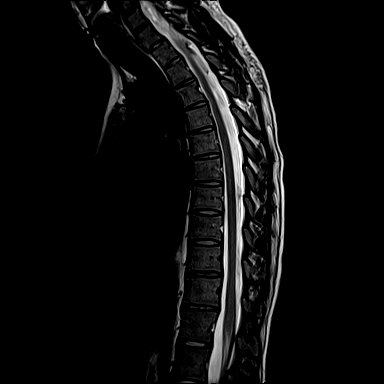
[im 13/18]
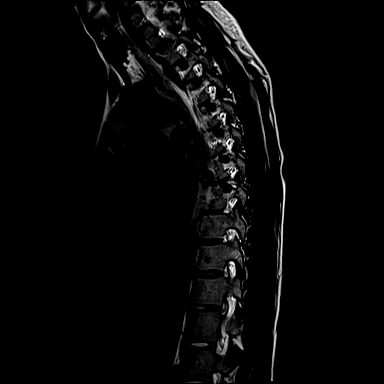
[im 18/18]
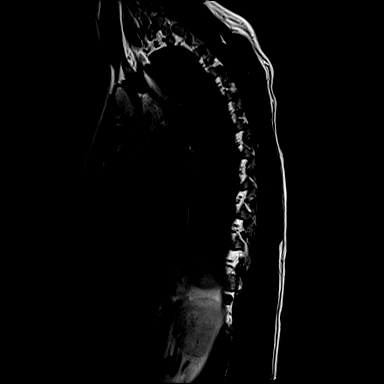

[Series 20: T2 · axial · 4.0mm · 0.78mm/px · z∈[-260,-48]mm · 4 of 15 slices shown (2 of 2)]
[im 1/15]
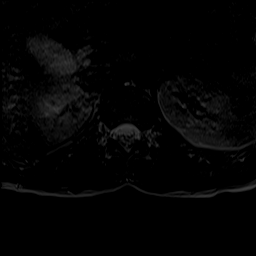
[im 5/15]
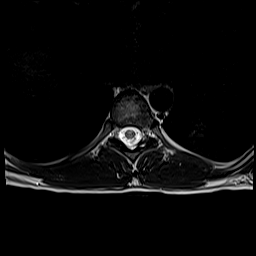
[im 10/15]
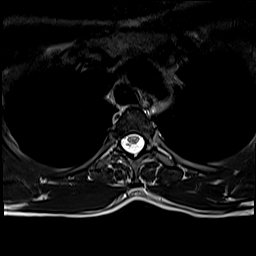
[im 15/15]
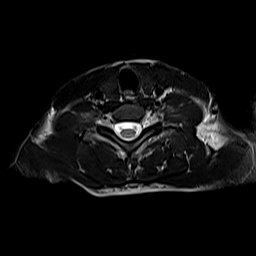

[Series 22: T1 · axial · 4.0mm · 0.39mm/px · z∈[-260,-48]mm · 4 of 15 slices shown (3 of 3)]
[im 1/15]
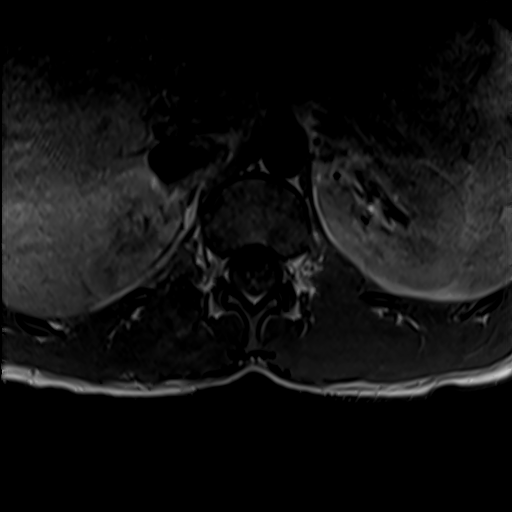
[im 5/15]
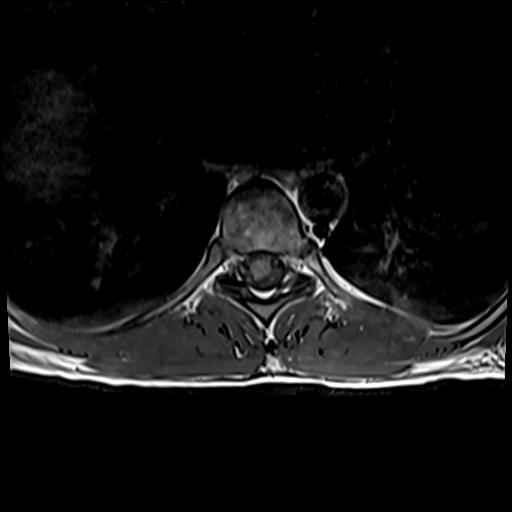
[im 10/15]
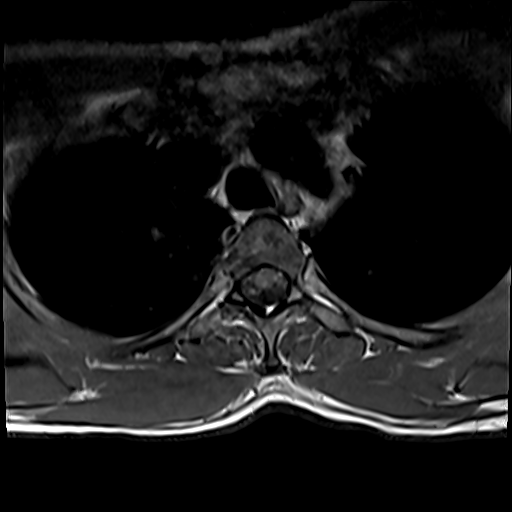
[im 15/15]
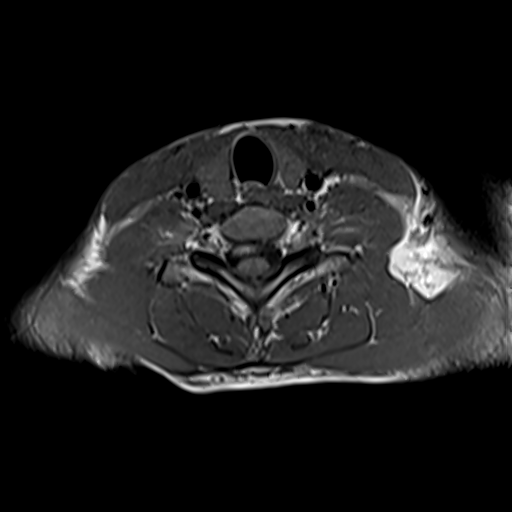

[Series 23: T2 post-contrast · sagittal · 3.0mm · 0.83mm/px · 4 of 15 slices shown]
[im 1/15]
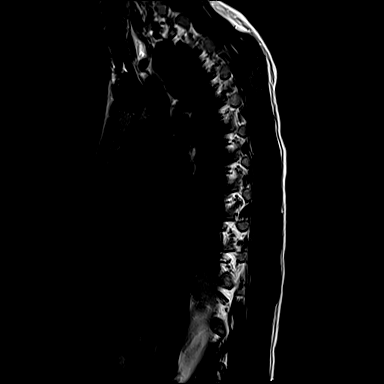
[im 5/15]
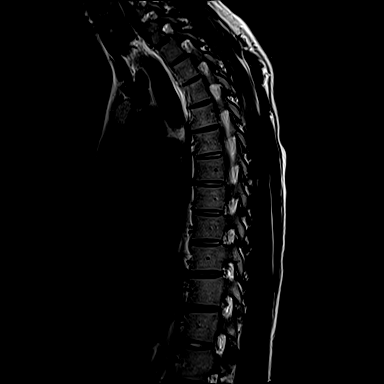
[im 10/15]
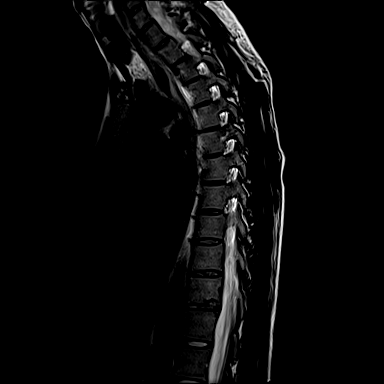
[im 15/15]
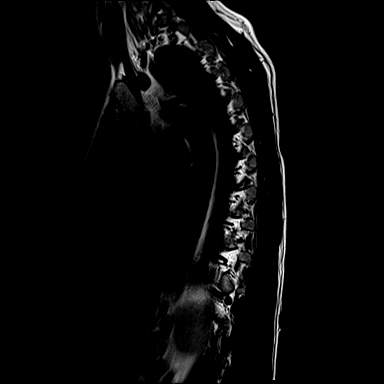

[Series 24: T1 fat-sat post-contrast · sagittal · 3.0mm · 1.00mm/px · 4 of 15 slices shown]
[im 1/15]
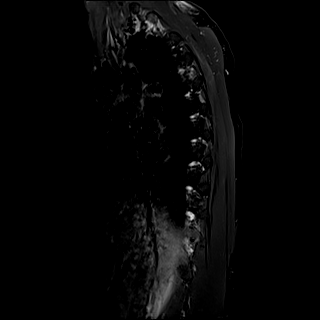
[im 5/15]
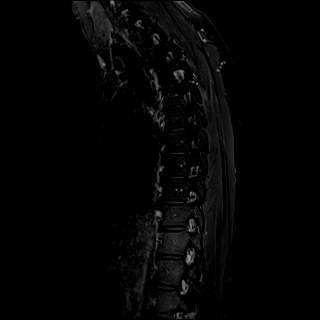
[im 10/15]
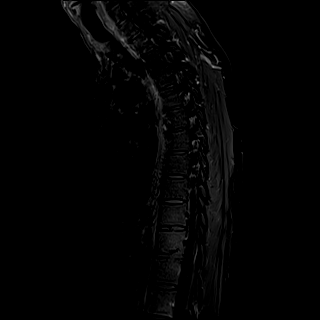
[im 15/15]
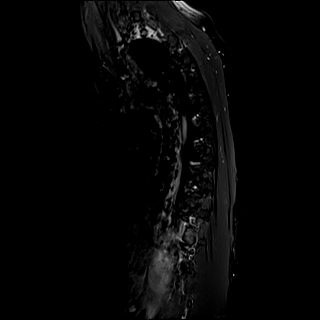

[Series 25: T1 post-contrast · axial · 4.0mm · 0.39mm/px · z∈[-265,-208]mm · 2 of 39 slices shown]
[im 1/39]
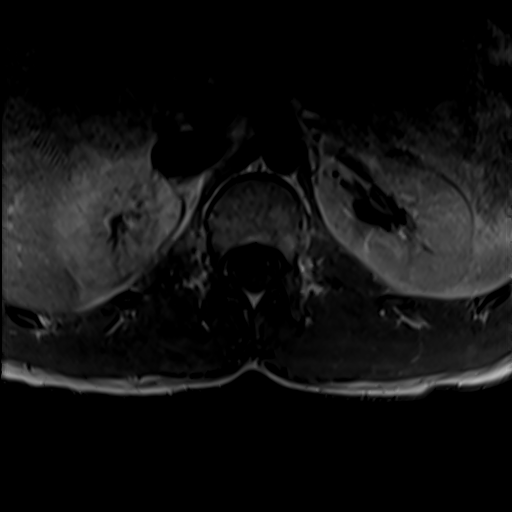
[im 8/39]
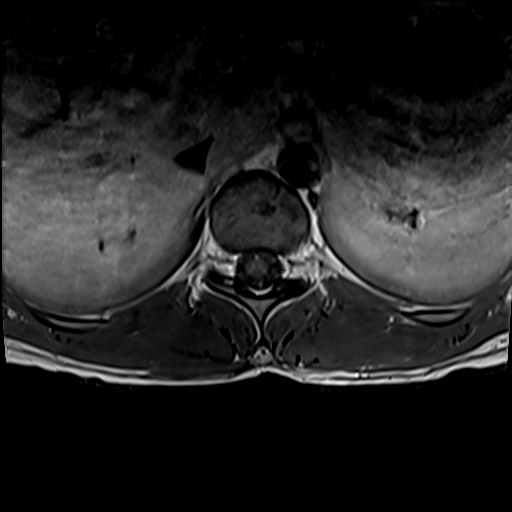

[35 of 48 positions shown; findings below may reference images not displayed]

FINDINGS: Alignment: Physiologic with preservation of the normal thoracic
kyphosis. No listhesis.

Vertebrae: Vertebral body height well maintained without acute or
chronic fracture. Bone marrow signal intensity within normal limits.
No discrete or worrisome osseous lesions. No abnormal marrow edema
or enhancement. No convincing findings to suggest osteomyelitis
discitis or septic arthritis.

Cord: Normal signal and morphology. No abnormal enhancement. No
epidural or intradural hematoma. No other collections.

Paraspinal and other soft tissues: Paraspinous soft tissues
demonstrate no acute finding. Visualized visceral structures
unremarkable. Partially visualized lungs are grossly clear.

Disc levels:

No significant disc pathology seen within the thoracic spine. No
significant disc bulge or focal disc herniation. No canal or neural
foraminal stenosis or evidence for neural impingement.
IMPRESSION: 1. Negative MRI of the thoracic spine. No epidural hematoma or other
complication status post recent intrathecal injection. No evidence
for acute infection.
2. No significant disc pathology within the thoracic spine. No
stenosis or neural impingement.

## 2021-01-19 MED ORDER — GADOBUTROL 1 MMOL/ML IV SOLN
6.0000 mL | Freq: Once | INTRAVENOUS | Status: AC | PRN
  Administered 2021-01-19: 6 mL via INTRAVENOUS

## 2021-01-19 MED ORDER — ONDANSETRON HCL 4 MG/2ML IJ SOLN
4.0000 mg | Freq: Once | INTRAMUSCULAR | Status: AC
  Administered 2021-01-20: 4 mg via INTRAVENOUS
  Filled 2021-01-19: qty 2

## 2021-01-19 MED ORDER — MORPHINE SULFATE (PF) 4 MG/ML IV SOLN
4.0000 mg | Freq: Once | INTRAVENOUS | Status: AC
  Administered 2021-01-19: 4 mg via INTRAVENOUS
  Filled 2021-01-19: qty 1

## 2021-01-19 NOTE — ED Triage Notes (Signed)
PT reports he began having severe back spasms that radiate to his abdomen. 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 Denies any lose of sensation and no loss of control of bladder or bowel.

## 2021-01-19 NOTE — ED Notes (Signed)
Date and time results received: 01/19/21 2236  Test: Neutro Abs Critical Value: 0.2  Name of Provider Notified: Tegeler  Orders Received? Or Actions Taken?: n/a

## 2021-01-19 NOTE — ED Provider Notes (Addendum)
[name] DEPT Provider Note   CSN: [id number] Arrival date & time: 01/19/21  1845     History Chief Complaint  Patient presents with   Back Pain   Spasms   Chemo patient    Gabriel Wheeler is a 30 y.o. male.  The history is provided by the patient and medical records. No language interpreter was used.  Back Pain Location:  Thoracic spine Quality:  Shooting Radiates to:  Does not radiate Pain severity:  Severe Onset quality:  Sudden Duration:  2 days Timing:  Sporadic Progression:  Waxing and waning Relieved by:  Nothing Worsened by:  Nothing Ineffective treatments:  None tried Associated symptoms: no abdominal pain, no bladder incontinence, no bowel incontinence, no chest pain, no fever, no headaches, no numbness, no paresthesias, no tingling and no weakness       Past Medical History:  Diagnosis Date   Cancer ([place])    No pertinent past medical history     Patient Active Problem List   Diagnosis Date Noted   Patellofemoral joint pain, right 10/06/2019     No family history on file.  Social History   Tobacco Use   Smoking status: Never   Smokeless tobacco: Never  Substance Use Topics   Alcohol use: Not Currently   Drug use: Never    Home Medications Prior to Admission medications   Not on File    Allergies    Patient has no known allergies.  Review of Systems   Review of Systems  Constitutional:  Negative for chills, fatigue and fever.  HENT:  Negative for congestion.   Respiratory:  Negative for cough and shortness of breath.   Cardiovascular:  Negative for chest pain.  Gastrointestinal:  Negative for abdominal pain, bowel incontinence, constipation, diarrhea, nausea and vomiting.  Genitourinary:  Negative for bladder incontinence, flank pain and frequency.  Musculoskeletal:  Positive for back pain. Negative for neck pain and neck stiffness.  Skin:  Negative for rash and wound.  Neurological:  Negative  for dizziness, tingling, weakness, light-headedness, numbness, headaches and paresthesias.  Psychiatric/Behavioral:  Negative for agitation.   All other systems reviewed and are negative.  Physical Exam Updated Vital Signs BP 111/79   Pulse (!) 118   Temp 98.3 F (36.8 C) (Oral)   Resp (!) 21   SpO2 100%   Physical Exam Vitals and nursing note reviewed.  Constitutional:      General: He is not in acute distress.    Appearance: He is well-developed. He is not ill-appearing, toxic-appearing or diaphoretic.  HENT:     Head: Normocephalic and atraumatic.     Mouth/Throat:     Mouth: Mucous membranes are moist.  Eyes:     Conjunctiva/sclera: Conjunctivae normal.  Cardiovascular:     Rate and Rhythm: Normal rate and regular rhythm.     Heart sounds: No murmur heard. Pulmonary:     Effort: Pulmonary effort is normal. No respiratory distress.     Breath sounds: Normal breath sounds. No wheezing, rhonchi or rales.  Chest:     Chest wall: No tenderness.  Abdominal:     General: Abdomen is flat.     Palpations: Abdomen is soft.     Tenderness: There is no abdominal tenderness. There is no right CVA tenderness, left CVA tenderness, guarding or rebound.  Musculoskeletal:        General: Tenderness present.     Cervical back: Neck supple.     Thoracic back: Spasms and  tenderness present.       Back:  Skin:    General: Skin is warm and dry.     Capillary Refill: Capillary refill takes less than 2 seconds.     Findings: No erythema.  Neurological:     General: No focal deficit present.     Mental Status: He is alert.     Sensory: No sensory deficit.     Motor: No weakness.  Psychiatric:        Mood and Affect: Mood normal.    ED Results / Procedures / Treatments   Labs (all labs ordered are listed, but only abnormal results are displayed) Labs Reviewed  CBC WITH DIFFERENTIAL/PLATELET - Abnormal; Notable for the following components:      Result Value   WBC 1.4 (*)    HCT  38.8 (*)    MCHC 37.1 (*)    Platelets 67 (*)    nRBC 1.4 (*)    Neutro Abs 0.2 (*)    Lymphs Abs 0.6 (*)    Abs Immature Granulocytes 0.08 (*)    All other components within normal limits  COMPREHENSIVE METABOLIC PANEL - Abnormal; Notable for the following components:   Chloride 96 (*)    Glucose, Bld 106 (*)    All other components within normal limits  URINALYSIS, ROUTINE W REFLEX MICROSCOPIC - Abnormal; Notable for the following components:   Glucose, UA 150 (*)    All other components within normal limits  URINE CULTURE  MAGNESIUM    EKG None  Radiology No results found.  Procedures Procedures   Medications Ordered in ED Medications  ondansetron (ZOFRAN) injection 4 mg (has no administration in time range)  morphine 4 MG/ML injection 4 mg (4 mg Intravenous Given 01/19/21 2101)  gadobutrol (GADAVIST) 1 MMOL/ML injection 6 mL (6 mLs Intravenous Contrast Given 01/19/21 2239)    ED Course  I have reviewed the triage vital signs and the nursing notes.  Pertinent labs & imaging results that were available during my care of the patient were reviewed by me and considered in my medical decision making (see chart for details).    MDM Rules/Calculators/A&P                           Gabriel Wheeler is a 30 y.o. 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  According to patient, he has cancer in his back and other places and is managed at [facility].  He reports that he received chemotherapy several days ago from a lumbar puncture and via IV.  He says that since yesterday, he started having mild back pain that has rapidly worsened.  It is in his mid back.  He reports he gets up to 9 out of 10 but in between improves.  He denies fevers or chills and denies any congestion, cough, chest pain, shortness of breath.  Denies  nausea, vomiting, constipation, diarrhea, or acute urinary changes.  Denies numbness or tingling of the legs and still able to ambulate.  Positions seem to help.  He reports that today he played basketball and symptoms seem to have worsened.  He denies any acute trauma however.  On exam, patient did not have significant midline tenderness but paraspinal he was tender at times.  I did not see any rash.  Legs had  normal sensation and strength and normal gait.  Chest was nontender.  Lungs clear.  Abdomen nontender.  Had a shared decision-making conversation with patient and family.  Clinically I suspect he is having intermittent muscle spasms causing his discomfort.  Given the waxing and waning nature of his symptoms I have less concern for meningitis or acute infection or pathologic fracture however, patient said he talked to his oncology team and they were concerned that this could be a rapidly progressive tumor that could have led to abnormality such as pathologic fracture or muscle infiltration.  After discussion with patient, he is interested in getting some blood work to look for electrolyte abnormality leading to spasms as well as get an MRI of the thoracic spine to look for cancer progression or pathologic fracture.  If labs and imaging do not show acute abnormalities, anticipate discharge with muscle relaxant and Lidoderm patches to follow-up with his PCP and oncology team for further management of back spasms.    Patient's labs began to return showing he does indeed have low white count and neutrophils like due to chemotherapy.  Also decreased platelets.  No evidence of urinary tract infection causing symptoms.  Other labs reassuring.  Patient awaiting results of MRI.  If there is any acute concerning findings, anticipate discussion with patient about management however I suspect is going to be musculoskeletal spasm and pain and he will be stable for discharge home with Flexeril and Lidoderm  patches.  Care transferred to oncoming team awaiting for results of MRI.   Final Clinical Impression(s) / ED Diagnoses Final diagnoses:  Muscle spasm  Acute bilateral thoracic back pain    Clinical Impression: 1. Muscle spasm   2. Acute bilateral thoracic back pain     Disposition: Care transferred to oncoming team awaiting for MRI results.  Dissipate discharge with Flexeril and Lidoderm patches if imaging is not revealing any concerning findings.  This note was prepared with assistance of Systems analyst. Occasional wrong-word or sound-a-like substitutions may have occurred due to the inherent limitations of voice recognition software.     Quade Ramirez, Gwenyth Allegra, MD 01/20/21 0001    Gabriel Wheeler, Gwenyth Allegra, MD 01/20/21 [phone]

## 2021-01-19 NOTE — ED Notes (Addendum)
Water given  

## 2021-01-19 NOTE — ED Notes (Addendum)
Date and time results received: 01/19/21 2235  Test: WBC   Critical Value: 1.4  Name of Provider Notified: Tegeler  Orders Received? Or Actions Taken?: n/a

## 2021-01-20 ENCOUNTER — Encounter (HOSPITAL_COMMUNITY): Payer: Self-pay | Admitting: Emergency Medicine

## 2021-01-20 MED ORDER — METHOCARBAMOL 500 MG PO TABS
1000.0000 mg | ORAL_TABLET | Freq: Once | ORAL | Status: AC
  Administered 2021-01-20: 1000 mg via ORAL
  Filled 2021-01-20: qty 2

## 2021-01-20 MED ORDER — LIDOCAINE 5 % EX PTCH
3.0000 | MEDICATED_PATCH | CUTANEOUS | Status: DC
Start: 2021-01-20 — End: 2021-01-20
  Administered 2021-01-20: 3 via TRANSDERMAL
  Filled 2021-01-20: qty 3

## 2021-01-20 MED ORDER — LIDOCAINE 5 % EX PTCH: 1.0000 | MEDICATED_PATCH | CUTANEOUS | 0 refills | Status: AC

## 2021-01-20 MED ORDER — SODIUM CHLORIDE 0.9 % IV SOLN: INTRAVENOUS | Status: DC

## 2021-01-20 MED ORDER — METHOCARBAMOL 500 MG PO TABS: 500.0000 mg | ORAL_TABLET | Freq: Two times a day (BID) | ORAL | 0 refills | Status: AC

## 2021-01-20 NOTE — ED Provider Notes (Addendum)
111 am case d/w Dr. Larence Penning of oncology at [facility], labs and imaging were reviewed by phone.  Patient received chemo and this is expected for platelets and white count to drop, he has received Neulasta so counts should recover.  He is reassured by negative MRI of the T spine. Patient has lab appointment Tuesday for recheck.  Continue home narcotics.  Make sure patient is taking claritin.  Please advise if any fever, or bruising or bleeding from the gums or petechiae to call and come to [facility].    Can add lidoderm and robaxin.  This was done in the ED and RX sent to the pharmacy.    This was discussed with the patient who is comfortable going home.      Patient and wife given strict neutropenia and bleeding precautions.        Gabriel Yum, MD 01/20/21 [phone]

## 2021-01-21 LAB — URINE CULTURE: Culture: NO GROWTH
